# Patient Record
Sex: Female | Born: 1975 | Race: Asian | Hispanic: No | Marital: Married
Health system: Southern US, Community
[De-identification: ages and names within clinical notes are randomized; demographics above are authoritative.]

---

## 1998-11-26 ENCOUNTER — Inpatient Hospital Stay (HOSPITAL_COMMUNITY): Admission: AD | Admit: 1998-11-26 | Discharge: 1998-11-26 | Payer: Self-pay | Admitting: Obstetrics and Gynecology

## 1998-12-07 ENCOUNTER — Inpatient Hospital Stay (HOSPITAL_COMMUNITY): Admission: AD | Admit: 1998-12-07 | Discharge: 1998-12-08 | Payer: Self-pay | Admitting: Obstetrics and Gynecology

## 1999-01-10 ENCOUNTER — Other Ambulatory Visit: Admission: RE | Admit: 1999-01-10 | Discharge: 1999-01-10 | Payer: Self-pay | Admitting: Obstetrics and Gynecology

## 1999-12-07 ENCOUNTER — Inpatient Hospital Stay (HOSPITAL_COMMUNITY): Admission: AD | Admit: 1999-12-07 | Discharge: 1999-12-09 | Payer: Self-pay | Admitting: Obstetrics and Gynecology

## 2000-01-23 ENCOUNTER — Other Ambulatory Visit: Admission: RE | Admit: 2000-01-23 | Discharge: 2000-01-23 | Payer: Self-pay | Admitting: Obstetrics and Gynecology

## 2000-01-30 ENCOUNTER — Ambulatory Visit (HOSPITAL_COMMUNITY): Admission: RE | Admit: 2000-01-30 | Discharge: 2000-01-30 | Payer: Self-pay | Admitting: Obstetrics and Gynecology

## 2001-02-13 ENCOUNTER — Other Ambulatory Visit: Admission: RE | Admit: 2001-02-13 | Discharge: 2001-02-13 | Payer: Self-pay | Admitting: Obstetrics and Gynecology

## 2002-05-02 ENCOUNTER — Other Ambulatory Visit: Admission: RE | Admit: 2002-05-02 | Discharge: 2002-05-02 | Payer: Self-pay | Admitting: Obstetrics and Gynecology

## 2003-08-13 ENCOUNTER — Other Ambulatory Visit: Admission: RE | Admit: 2003-08-13 | Discharge: 2003-08-13 | Payer: Self-pay | Admitting: Obstetrics and Gynecology

## 2004-12-16 ENCOUNTER — Other Ambulatory Visit: Admission: RE | Admit: 2004-12-16 | Discharge: 2004-12-16 | Payer: Self-pay | Admitting: Obstetrics and Gynecology

## 2005-12-08 ENCOUNTER — Emergency Department (HOSPITAL_COMMUNITY): Admission: EM | Admit: 2005-12-08 | Discharge: 2005-12-08 | Payer: Self-pay | Admitting: Emergency Medicine
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
## 2013-11-27 ENCOUNTER — Other Ambulatory Visit: Payer: Self-pay | Admitting: Obstetrics and Gynecology

## 2013-11-27 DIAGNOSIS — R928 Other abnormal and inconclusive findings on diagnostic imaging of breast: Secondary | ICD-10-CM

## 2013-12-17 ENCOUNTER — Ambulatory Visit
Admission: RE | Admit: 2013-12-17 | Discharge: 2013-12-17 | Disposition: A | Discharge status: Home / Self Care | Payer: Self-pay | Source: Ambulatory Visit | Attending: Obstetrics and Gynecology | Admitting: Obstetrics and Gynecology

## 2013-12-17 DIAGNOSIS — R928 Other abnormal and inconclusive findings on diagnostic imaging of breast: Secondary | ICD-10-CM

## 2022-11-07 DIAGNOSIS — R0789 Other chest pain: Secondary | ICD-10-CM | POA: Diagnosis not present

## 2022-11-07 DIAGNOSIS — R0602 Shortness of breath: Secondary | ICD-10-CM | POA: Diagnosis not present

## 2022-11-07 DIAGNOSIS — R5383 Other fatigue: Secondary | ICD-10-CM | POA: Diagnosis not present

## 2022-11-14 ENCOUNTER — Ambulatory Visit
Admission: RE | Admit: 2022-11-14 | Discharge: 2022-11-14 | Disposition: A | Discharge status: Home / Self Care | Payer: 59 | Source: Ambulatory Visit | Attending: Physician Assistant | Admitting: Physician Assistant

## 2022-11-14 ENCOUNTER — Other Ambulatory Visit: Payer: Self-pay | Admitting: Physician Assistant

## 2022-11-14 DIAGNOSIS — R059 Cough, unspecified: Secondary | ICD-10-CM | POA: Diagnosis not present

## 2022-11-14 DIAGNOSIS — R5383 Other fatigue: Secondary | ICD-10-CM | POA: Diagnosis not present

## 2022-11-14 DIAGNOSIS — R0789 Other chest pain: Secondary | ICD-10-CM

## 2023-01-03 DIAGNOSIS — D509 Iron deficiency anemia, unspecified: Secondary | ICD-10-CM | POA: Diagnosis not present

## 2023-01-03 DIAGNOSIS — R0789 Other chest pain: Secondary | ICD-10-CM | POA: Diagnosis not present

## 2023-01-03 DIAGNOSIS — E038 Other specified hypothyroidism: Secondary | ICD-10-CM | POA: Diagnosis not present

## 2023-01-03 DIAGNOSIS — R7989 Other specified abnormal findings of blood chemistry: Secondary | ICD-10-CM | POA: Diagnosis not present

## 2023-01-08 DIAGNOSIS — R7989 Other specified abnormal findings of blood chemistry: Secondary | ICD-10-CM | POA: Diagnosis not present

## 2023-01-08 DIAGNOSIS — E038 Other specified hypothyroidism: Secondary | ICD-10-CM | POA: Diagnosis not present

## 2023-01-08 DIAGNOSIS — D509 Iron deficiency anemia, unspecified: Secondary | ICD-10-CM | POA: Diagnosis not present

## 2023-06-28 DIAGNOSIS — Z01419 Encounter for gynecological examination (general) (routine) without abnormal findings: Secondary | ICD-10-CM | POA: Diagnosis not present

## 2023-06-28 DIAGNOSIS — Z1231 Encounter for screening mammogram for malignant neoplasm of breast: Secondary | ICD-10-CM | POA: Diagnosis not present

## 2023-06-28 DIAGNOSIS — Z6826 Body mass index (BMI) 26.0-26.9, adult: Secondary | ICD-10-CM | POA: Diagnosis not present
